# Patient Record
Sex: Female | Born: 1960 | Hispanic: No | Marital: Married | Smoking: Never smoker
Health system: Southern US, Community
[De-identification: ages and names within clinical notes are randomized; demographics above are authoritative.]

## PROBLEM LIST (undated history)

## (undated) DIAGNOSIS — E785 Hyperlipidemia, unspecified: Secondary | ICD-10-CM

## (undated) DIAGNOSIS — E119 Type 2 diabetes mellitus without complications: Secondary | ICD-10-CM

## (undated) HISTORY — DX: Hyperlipidemia, unspecified: E78.5

## (undated) HISTORY — DX: Type 2 diabetes mellitus without complications: E11.9

---

## 2006-12-13 ENCOUNTER — Encounter: Admission: RE | Admit: 2006-12-13 | Discharge: 2006-12-13 | Payer: Self-pay | Admitting: Internal Medicine

## 2008-01-02 ENCOUNTER — Encounter: Admission: RE | Admit: 2008-01-02 | Discharge: 2008-01-02 | Payer: Self-pay | Admitting: Internal Medicine

## 2008-01-18 ENCOUNTER — Encounter: Admission: RE | Admit: 2008-01-18 | Discharge: 2008-01-18 | Payer: Self-pay | Admitting: Internal Medicine

## 2009-01-05 HISTORY — PX: CATARACT EXTRACTION W/ INTRAOCULAR LENS IMPLANT: SHX1309

## 2010-01-26 ENCOUNTER — Encounter: Payer: Self-pay | Admitting: Internal Medicine

## 2010-06-24 ENCOUNTER — Other Ambulatory Visit: Payer: Self-pay | Admitting: Internal Medicine

## 2010-06-24 DIAGNOSIS — Z1231 Encounter for screening mammogram for malignant neoplasm of breast: Secondary | ICD-10-CM

## 2010-07-03 ENCOUNTER — Ambulatory Visit
Admission: RE | Admit: 2010-07-03 | Discharge: 2010-07-03 | Disposition: A | Discharge status: Home / Self Care | Payer: PRIVATE HEALTH INSURANCE | Source: Ambulatory Visit | Attending: Internal Medicine | Admitting: Internal Medicine

## 2010-07-03 DIAGNOSIS — Z1231 Encounter for screening mammogram for malignant neoplasm of breast: Secondary | ICD-10-CM

## 2010-11-11 ENCOUNTER — Ambulatory Visit
Admission: RE | Admit: 2010-11-11 | Discharge: 2010-11-11 | Disposition: A | Discharge status: Home / Self Care | Payer: PRIVATE HEALTH INSURANCE | Source: Ambulatory Visit | Attending: Internal Medicine | Admitting: Internal Medicine

## 2010-11-11 ENCOUNTER — Other Ambulatory Visit: Payer: Self-pay | Admitting: Internal Medicine

## 2010-11-11 DIAGNOSIS — R609 Edema, unspecified: Secondary | ICD-10-CM

## 2011-04-22 ENCOUNTER — Other Ambulatory Visit: Payer: Self-pay | Admitting: Internal Medicine

## 2011-04-22 DIAGNOSIS — M7989 Other specified soft tissue disorders: Secondary | ICD-10-CM

## 2011-04-27 ENCOUNTER — Ambulatory Visit
Admission: RE | Admit: 2011-04-27 | Discharge: 2011-04-27 | Disposition: A | Discharge status: Home / Self Care | Payer: PRIVATE HEALTH INSURANCE | Source: Ambulatory Visit | Attending: Internal Medicine | Admitting: Internal Medicine

## 2011-04-27 DIAGNOSIS — M7989 Other specified soft tissue disorders: Secondary | ICD-10-CM

## 2013-05-24 ENCOUNTER — Other Ambulatory Visit: Payer: Self-pay

## 2013-05-24 DIAGNOSIS — Z1231 Encounter for screening mammogram for malignant neoplasm of breast: Secondary | ICD-10-CM

## 2013-05-25 ENCOUNTER — Other Ambulatory Visit: Payer: Self-pay | Admitting: Internal Medicine

## 2013-05-25 ENCOUNTER — Ambulatory Visit
Admission: RE | Admit: 2013-05-25 | Discharge: 2013-05-25 | Disposition: A | Discharge status: Home / Self Care | Payer: PRIVATE HEALTH INSURANCE | Source: Ambulatory Visit | Attending: Internal Medicine | Admitting: Internal Medicine

## 2013-05-25 DIAGNOSIS — R7612 Nonspecific reaction to cell mediated immunity measurement of gamma interferon antigen response without active tuberculosis: Secondary | ICD-10-CM

## 2013-05-30 ENCOUNTER — Ambulatory Visit
Admission: RE | Admit: 2013-05-30 | Discharge: 2013-05-30 | Disposition: A | Discharge status: Home / Self Care | Payer: PRIVATE HEALTH INSURANCE | Source: Ambulatory Visit

## 2013-05-30 DIAGNOSIS — Z1231 Encounter for screening mammogram for malignant neoplasm of breast: Secondary | ICD-10-CM

## 2013-06-08 ENCOUNTER — Ambulatory Visit (INDEPENDENT_AMBULATORY_CARE_PROVIDER_SITE_OTHER): Payer: PRIVATE HEALTH INSURANCE | Admitting: Internal Medicine

## 2013-06-08 ENCOUNTER — Encounter: Payer: Self-pay | Admitting: Internal Medicine

## 2013-06-08 VITALS — BP 143/83 | HR 86 | Temp 98.1°F | Ht 64.0 in | Wt 155.0 lb

## 2013-06-08 DIAGNOSIS — M793 Panniculitis, unspecified: Secondary | ICD-10-CM

## 2013-06-08 NOTE — Progress Notes (Signed)
   Subjective:    Patient ID: Christina Lynn, female    DOB: [birth year], 53 y.o.   MRN: [id number]  HPI Here for evaluation as a new patient.  Has a history of paniculitis of left calf, biopsied and was c/w paniculitis on pathology.  It resolved without treatment but now comes in with similar syptoms of right calf, upper part, that is indurated.  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  She was treated with 3 drug therapy for active TB for 18 months.  Her PPD at the time thhough was negative.  She has no history of pulmonary disease.  She was evaluated for vascular and venous insufficiency with no issues identified.  She has had a weakly positive ANA, negative RPR.  She does have a history of BCG vaccine.  She had a positive Quantiferon Gold test as part of her recent evaluation.  No fever, no chills.  It has been very puruitic and has scars from scrathcing.     Review of Systems  Constitutional: Negative for fever, chills and fatigue.  Gastrointestinal: Negative for nausea and diarrhea.  Skin: Negative for rash.       Itching, swelling  Neurological: Negative for dizziness.  Hematological: Negative for adenopathy.       Objective:   Physical Exam  Constitutional: She appears well-developed and well-nourished. No distress.  Musculoskeletal:  Indurated area right upper calf about 3-4 cm x 2 cm.  Non tender, some mild warmth, no erythema.   Skin:  She has multipl areas of hyperpigmented area from scratch marks.          Assessment & Plan:

## 2013-06-08 NOTE — Assessment & Plan Note (Addendum)
This may again be panniculitis.  Differential of cause is broad.  No obvious cause in her.  Previous biopsy with inflammation c/w panniculitis.   Positive Quantiferon Gold of unclear significance. If she previouslyhad TB, it would be expected to be positive, also with history of BCG, it could be positive so does not narrow etiology.   I would not consider this latent Tb since she has previously been treated.      I would recommend biopsy for culture for AFB smear and cultures, bacterial culture, fungal culture and evaluation for parasites and histology.  I have asked her to schedule with Dr. Terri Piedra who she has seen before and I will ask him to consider biopsy for culture and anything else he may consider.  I will follow up with her following evaluation by Dr. Terri Piedra in about 4-5 weeks.

## 2013-07-13 ENCOUNTER — Ambulatory Visit: Payer: PRIVATE HEALTH INSURANCE | Admitting: Internal Medicine

## 2014-01-05 HISTORY — PX: BREAST BIOPSY: SHX20

## 2014-08-14 ENCOUNTER — Other Ambulatory Visit: Payer: Self-pay

## 2014-08-14 DIAGNOSIS — Z1231 Encounter for screening mammogram for malignant neoplasm of breast: Secondary | ICD-10-CM

## 2014-09-21 ENCOUNTER — Ambulatory Visit
Admission: RE | Admit: 2014-09-21 | Discharge: 2014-09-21 | Disposition: A | Discharge status: Home / Self Care | Payer: PRIVATE HEALTH INSURANCE | Source: Ambulatory Visit

## 2014-09-21 DIAGNOSIS — Z1231 Encounter for screening mammogram for malignant neoplasm of breast: Secondary | ICD-10-CM

## 2014-09-25 ENCOUNTER — Other Ambulatory Visit: Payer: Self-pay | Admitting: Internal Medicine

## 2014-09-25 DIAGNOSIS — R928 Other abnormal and inconclusive findings on diagnostic imaging of breast: Secondary | ICD-10-CM

## 2014-09-28 ENCOUNTER — Ambulatory Visit
Admission: RE | Admit: 2014-09-28 | Discharge: 2014-09-28 | Disposition: A | Discharge status: Home / Self Care | Payer: PRIVATE HEALTH INSURANCE | Source: Ambulatory Visit | Attending: Internal Medicine | Admitting: Internal Medicine

## 2014-09-28 ENCOUNTER — Other Ambulatory Visit: Payer: Self-pay | Admitting: Internal Medicine

## 2014-09-28 DIAGNOSIS — R928 Other abnormal and inconclusive findings on diagnostic imaging of breast: Secondary | ICD-10-CM

## 2014-09-28 DIAGNOSIS — R921 Mammographic calcification found on diagnostic imaging of breast: Secondary | ICD-10-CM

## 2014-10-01 ENCOUNTER — Ambulatory Visit
Admission: RE | Admit: 2014-10-01 | Discharge: 2014-10-01 | Disposition: A | Discharge status: Home / Self Care | Payer: PRIVATE HEALTH INSURANCE | Source: Ambulatory Visit | Attending: Internal Medicine | Admitting: Internal Medicine

## 2014-10-01 DIAGNOSIS — R921 Mammographic calcification found on diagnostic imaging of breast: Secondary | ICD-10-CM

## 2015-10-08 ENCOUNTER — Other Ambulatory Visit: Payer: Self-pay | Admitting: Internal Medicine

## 2015-10-08 DIAGNOSIS — Z1231 Encounter for screening mammogram for malignant neoplasm of breast: Secondary | ICD-10-CM

## 2015-10-18 ENCOUNTER — Ambulatory Visit
Admission: RE | Admit: 2015-10-18 | Discharge: 2015-10-18 | Disposition: A | Discharge status: Home / Self Care | Payer: PRIVATE HEALTH INSURANCE | Source: Ambulatory Visit | Attending: Internal Medicine | Admitting: Internal Medicine

## 2015-10-18 DIAGNOSIS — Z1231 Encounter for screening mammogram for malignant neoplasm of breast: Secondary | ICD-10-CM

## 2015-10-22 ENCOUNTER — Other Ambulatory Visit: Payer: Self-pay | Admitting: Internal Medicine

## 2015-10-22 DIAGNOSIS — R928 Other abnormal and inconclusive findings on diagnostic imaging of breast: Secondary | ICD-10-CM

## 2015-10-28 ENCOUNTER — Other Ambulatory Visit: Payer: PRIVATE HEALTH INSURANCE

## 2015-10-29 ENCOUNTER — Ambulatory Visit (INDEPENDENT_AMBULATORY_CARE_PROVIDER_SITE_OTHER): Payer: PRIVATE HEALTH INSURANCE | Admitting: Orthopedic Surgery

## 2015-10-29 ENCOUNTER — Encounter (INDEPENDENT_AMBULATORY_CARE_PROVIDER_SITE_OTHER): Payer: Self-pay | Admitting: Orthopedic Surgery

## 2015-10-29 ENCOUNTER — Ambulatory Visit (INDEPENDENT_AMBULATORY_CARE_PROVIDER_SITE_OTHER): Payer: PRIVATE HEALTH INSURANCE

## 2015-10-29 DIAGNOSIS — G8929 Other chronic pain: Secondary | ICD-10-CM

## 2015-10-29 DIAGNOSIS — M25511 Pain in right shoulder: Secondary | ICD-10-CM

## 2015-10-29 MED ORDER — METHYLPREDNISOLONE ACETATE 40 MG/ML IJ SUSP
40.0000 mg | INTRAMUSCULAR | Status: AC | PRN
  Administered 2015-10-29: 40 mg via INTRA_ARTICULAR

## 2015-10-29 MED ORDER — BUPIVACAINE HCL 0.5 % IJ SOLN
9.0000 mL | INTRAMUSCULAR | Status: AC | PRN
  Administered 2015-10-29: 9 mL via INTRA_ARTICULAR

## 2015-10-29 MED ORDER — LIDOCAINE HCL 1 % IJ SOLN
5.0000 mL | INTRAMUSCULAR | Status: AC | PRN
  Administered 2015-10-29: 5 mL

## 2015-10-29 NOTE — Progress Notes (Addendum)
Office Visit Note    Patient: Christina Lynn           Date of Birth: 09-14-1960           MRN: [id number] Visit Date: 10/29/2015              Requested by: No referring provider defined for this encounter. PCP: Ralene Ok, MD   Assessment & Plan: Visit Diagnoses:  1. Chronic right shoulder pain     Plan: Impression is right shoulder bursitis possible rotator cuff tear does not look like radiculopathy plan is to inject that shoulder today 9 mL of Marcaine was cc Depo-Medrol that helped her left shoulder in the past continue with anti-inflammatories if her symptoms recur then she'll need to come back for MRI scan we will 2 a note for her today to be out of work tomorrow.  Follow-Up Instructions: Return if symptoms worsen or fail to improve.   Orders:  Orders Placed This Encounter  Procedures  . Large Joint Injection/Arthrocentesis  . XR Shoulder Right   Meds ordered this encounter  Medications  . Dapagliflozin-Metformin HCl ER (XIGDUO XR) 10-998 MG TB24    Sig: Take by mouth.  . Insulin Glargine (LANTUS Hypoluxo)    Sig: Inject into the skin.  . cholecalciferol (VITAMIN D) 1000 units tablet    Sig: Take 1,000 Units by mouth daily.  . bupivacaine (MARCAINE) 0.5 % (with pres) injection 9 mL  . lidocaine (XYLOCAINE) 1 % (with pres) injection 5 mL  . methylPREDNISolone acetate (DEPO-MEDROL) injection 40 mg      Procedures: Large Joint Inj Date/Time: 10/29/2015 5:37 PM Performed by: Cammy Copa Authorized by: Cammy Copa   Consent Given by:  Patient Site marked: the procedure site was marked   Timeout: prior to procedure the correct patient, procedure, and site was verified   Indications:  Pain and diagnostic evaluation Location:  Shoulder Site:  R subacromial bursa Prep: patient was prepped and draped in usual sterile fashion   Needle Size:  18 G Needle Length:  1.5 inches Approach:  Posterior Ultrasound Guidance: No   Fluoroscopic Guidance: No   no Medications:  5 mL lidocaine 1 %; 9 mL bupivacaine 0.5 %; 40 mg methylPREDNISolone acetate 40 MG/ML Aspiration Attempted: No   Patient tolerance:  Patient tolerated the procedure well with no immediate complications     Clinical Data: No additional findings.   Subjective: Chief Complaint  Patient presents with  . Right Shoulder - Pain    HPI Problem bath the describes a two-month history H medical onset right shoulder pain.  Become severe over the past month.  Had an injection in the left shoulder previously which helped her get over a similar pain but that was several years ago.  She localizes the pain in the right arm to the deltoid region.  2 weeks ago she had a shot from an anterior approach.  Takes an Advil occasionally along with Relafen.  Nighttime symptoms are worse she denies any neck pain or numbness and tingling she works as a Social research officer, government the Health visitor adds  the following history.  Patient comes in today c/o 2 month history of right shoulder pain, NKI.  Pain is all around the shoulder jiont.  PCP gave her an injection 10/10/15, antieriorly, no relief.  Decreased and painful ROM.  She has taken Etodolac with no relief, she also has tried advil.  She has used topical OTC creams.     Review  of Systems all systems reviewed and negative is a relate to the right shoulder.  No fevers or chills   Objective: Vital Signs: There were no vitals taken for this visit.  Physical Exam on exam she is well-developed well-nourished distress alert and oriented normal by mass index normal gait and alignment rest from effort normal heart rate normal the sharp movements intact neck range of motion motion normal skin normal mood and normal affect normal  Ortho Exam right shoulder exam demonstrates full active and passive range of motion good rotator cuff strength a little bit of course grinding with internal/external rotation at 90 abduction and no before meals joint  tenderness direct palpation negative O'Brien's test negative apprehension relocation testing neck range of motion is normal.  Neck exam is normal.  No paresthesias C5 T1 reflexes normal radial pulse normal  Specialty Comments:  No specialty comments available.  Imaging: Xr Shoulder Right  Result Date: 10/29/2015 3 views right shoulder were ordered and obtained and interpreted.  Glenohumeral joint know arthritis before meals joint mild arthritis lung fields clear there is maintenance of the acromiohumeral distance other bony structures normal shoulder is located    PMFS History: Patient Active Problem List   Diagnosis Date Noted  . 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 Diabetes mellitus without complication (HCC)   . Hyperlipidemia     Family History  Problem Relation Age of Onset  . Diabetes Mother     Past Surgical History:  Procedure Laterality Date  . CATARACT EXTRACTION W/ INTRAOCULAR LENS IMPLANT Right 2011   Social History   Occupational History  . Not on file.   Social History Main Topics  . Smoking status: Never Smoker  . Smokeless tobacco: Never Used  . Alcohol use No  . Drug use: No  . Sexual activity: Not on file

## 2015-11-01 ENCOUNTER — Ambulatory Visit
Admission: RE | Admit: 2015-11-01 | Discharge: 2015-11-01 | Disposition: A | Discharge status: Home / Self Care | Payer: PRIVATE HEALTH INSURANCE | Source: Ambulatory Visit | Attending: Internal Medicine | Admitting: Internal Medicine

## 2015-11-01 DIAGNOSIS — R928 Other abnormal and inconclusive findings on diagnostic imaging of breast: Secondary | ICD-10-CM

## 2015-11-06 ENCOUNTER — Ambulatory Visit (INDEPENDENT_AMBULATORY_CARE_PROVIDER_SITE_OTHER): Payer: Self-pay | Admitting: Orthopedic Surgery

## 2015-12-09 ENCOUNTER — Ambulatory Visit (INDEPENDENT_AMBULATORY_CARE_PROVIDER_SITE_OTHER): Payer: PRIVATE HEALTH INSURANCE | Admitting: Orthopedic Surgery

## 2016-04-30 ENCOUNTER — Encounter (INDEPENDENT_AMBULATORY_CARE_PROVIDER_SITE_OTHER): Payer: Self-pay | Admitting: Orthopedic Surgery

## 2016-04-30 ENCOUNTER — Ambulatory Visit (INDEPENDENT_AMBULATORY_CARE_PROVIDER_SITE_OTHER): Payer: PRIVATE HEALTH INSURANCE | Admitting: Orthopedic Surgery

## 2016-04-30 DIAGNOSIS — M7501 Adhesive capsulitis of right shoulder: Secondary | ICD-10-CM

## 2016-04-30 DIAGNOSIS — M25511 Pain in right shoulder: Secondary | ICD-10-CM

## 2016-04-30 MED ORDER — METHYLPREDNISOLONE ACETATE 40 MG/ML IJ SUSP
40.0000 mg | INTRAMUSCULAR | Status: AC | PRN
  Administered 2016-04-30: 40 mg via INTRA_ARTICULAR

## 2016-04-30 MED ORDER — LIDOCAINE HCL 1 % IJ SOLN
5.0000 mL | INTRAMUSCULAR | Status: AC | PRN
  Administered 2016-04-30: 5 mL

## 2016-04-30 MED ORDER — BUPIVACAINE HCL 0.5 % IJ SOLN
9.0000 mL | INTRAMUSCULAR | Status: AC | PRN
  Administered 2016-04-30: 9 mL via INTRA_ARTICULAR

## 2016-04-30 NOTE — Progress Notes (Signed)
Office Visit Note   Patient: Christina Lynn           Date of Birth: 1960-12-01           MRN: [id number] Visit Date: 04/30/2016 Requested by: No referring provider defined for this encounter. PCP: Pcp Not In System  Subjective: Chief Complaint  Patient presents with  . 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  She had a subacromial injection in October of last year which helped for 1-2 months.  Now the pain is back and she reports increasing stiffness in the shoulder.  She is taking ibuprofen but that starting her for stomach.  She tried Biofreeze as well as minimal relief.  Reports primarily anterior and superior pain.  The pain will wake her from sleep at night.              ROS: All systems reviewed are negative as they relate to the chief complaint within the history of present illness.  Patient denies  fevers or chills.   Assessment & Plan: Visit Diagnoses:  1. Right shoulder pain, unspecified chronicity     Plan: Impression is early right frozen shoulder with some restriction of external rotation for flexion and abduction right versus left.  Rotator cuff strength is pretty reasonable.  Plan is MRI scan to rule out labral/biceps pathology.  Injection performed today into the glenohumeral joint.  We will also start physical therapy for manual PT and range of motion.  See her back in 6 weeks for clinical recheck.  Follow-Up Instructions: No Follow-up on file.   Orders:  No orders of the defined types were placed in this encounter.  No orders of the defined types were placed in this encounter.     Procedures: Large Joint Inj Date/Time: 04/30/2016 8:43 AM Performed by: Cammy Copa Authorized by: Cammy Copa   Consent Given by:  Patient Site marked: the procedure site was marked   Timeout: prior to procedure the correct patient, procedure, and site was verified   Indications:  Pain and  diagnostic evaluation Location:  Shoulder Site:  R glenohumeral Prep: patient was prepped and draped in usual sterile fashion   Needle Size:  18 G Needle Length:  1.5 inches Approach:  Posterior Ultrasound Guidance: No   Fluoroscopic Guidance: No   Arthrogram: No   Medications:  5 mL lidocaine 1 %; 9 mL bupivacaine 0.5 %; 40 mg methylPREDNISolone acetate 40 MG/ML Aspiration Attempted: No   Patient tolerance:  Patient tolerated the procedure well with no immediate complications     Clinical Data: No additional findings.  Objective: Vital Signs: There were no vitals taken for this visit.  Physical Exam:   Constitutional: Patient appears well-developed HEENT:  Head: Normocephalic Eyes:EOM are normal Neck: Normal range of motion Cardiovascular: Normal rate Pulmonary/chest: Effort normal Neurologic: Patient is alert Skin: Skin is warm Psychiatric: Patient has normal mood and affect    Ortho Exam: Orthopedic exam demonstrates good cervical spine range of motion.  Palpable radial pulses.  5 out of 5 grip EPL FPL interosseous wrist flexion-extension biceps triceps and deltoid strength.  Patient has restricted external rotation at 15 abduction and 50 on the right compared to 70 on the left.  Isolated glenohumeral forward flexion 180 on the left compared to 140 on the right.  Isolated glenohumeral abduction and a 5 on the right compared to 110 on the left.  No other masses lymph  adenopathy or skin changes noted in the shoulder girdle region on the right  Specialty Comments:  No specialty comments available.  Imaging: No results found.   PMFS History: Patient Active Problem List   Diagnosis Date Noted  . 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 Diabetes mellitus without complication (HCC)   . Hyperlipidemia     Family History  Problem Relation Age of Onset  . Diabetes Mother     Past Surgical History:  Procedure Laterality  Date  . CATARACT EXTRACTION W/ INTRAOCULAR LENS IMPLANT Right 2011   Social History   Occupational History  . Not on file.   Social History Main Topics  . Smoking status: Never Smoker  . Smokeless tobacco: Never Used  . Alcohol use No  . Drug use: No  . Sexual activity: Not on file

## 2016-11-11 ENCOUNTER — Other Ambulatory Visit: Payer: Self-pay | Admitting: Internal Medicine

## 2016-11-11 DIAGNOSIS — Z1231 Encounter for screening mammogram for malignant neoplasm of breast: Secondary | ICD-10-CM

## 2016-11-18 ENCOUNTER — Ambulatory Visit
Admission: RE | Admit: 2016-11-18 | Discharge: 2016-11-18 | Disposition: A | Discharge status: Home / Self Care | Payer: PRIVATE HEALTH INSURANCE | Source: Ambulatory Visit | Attending: Internal Medicine | Admitting: Internal Medicine

## 2016-11-18 DIAGNOSIS — Z1231 Encounter for screening mammogram for malignant neoplasm of breast: Secondary | ICD-10-CM

## 2020-11-05 ENCOUNTER — Other Ambulatory Visit: Payer: Self-pay | Admitting: Internal Medicine

## 2020-11-05 DIAGNOSIS — Z1231 Encounter for screening mammogram for malignant neoplasm of breast: Secondary | ICD-10-CM

## 2020-11-06 ENCOUNTER — Other Ambulatory Visit: Payer: Self-pay

## 2020-11-06 ENCOUNTER — Ambulatory Visit
Admission: RE | Admit: 2020-11-06 | Discharge: 2020-11-06 | Disposition: A | Discharge status: Home / Self Care | Payer: Managed Care, Other (non HMO) | Source: Ambulatory Visit | Attending: Internal Medicine | Admitting: Internal Medicine

## 2020-11-06 DIAGNOSIS — Z1231 Encounter for screening mammogram for malignant neoplasm of breast: Secondary | ICD-10-CM

## 2022-11-26 ENCOUNTER — Ambulatory Visit: Payer: Managed Care, Other (non HMO) | Admitting: Orthopedic Surgery

## 2022-11-26 ENCOUNTER — Other Ambulatory Visit (INDEPENDENT_AMBULATORY_CARE_PROVIDER_SITE_OTHER): Payer: Managed Care, Other (non HMO)

## 2022-11-26 ENCOUNTER — Other Ambulatory Visit: Payer: Self-pay

## 2022-11-26 DIAGNOSIS — M79601 Pain in right arm: Secondary | ICD-10-CM

## 2022-11-26 DIAGNOSIS — M7551 Bursitis of right shoulder: Secondary | ICD-10-CM

## 2022-11-27 NOTE — Progress Notes (Unsigned)
Office Visit Note   Patient: Christina Lynn           Date of Birth: 09/10/1960           MRN: [id number] Visit Date: 11/26/2022 Requested by: Ralene Ok, MD [id number] [place],  [place] [address] PCP: Ralene Ok, MD  Subjective: Chief Complaint  Patient presents with   Right Shoulder - Pain   Neck - Pain    HPI: Christina Lynn is a 62 y.o. female who presents to the office reporting right shoulder pain for 4 months duration.  Denies a history of injury.  Does report some radiating arm pain as well as occasional numbness and tingling.  Denies much in way of neck pain.  Tylenol helps some with her symptoms.  She has an MRI scan from September 2024.  There is mild tendinitis of the supraspinatus tendon as well as some partial and full-thickness areas of cartilage loss in the glenohumeral joint..                ROS: All systems reviewed are negative as they relate to the chief complaint within the history of present illness.  Patient denies fevers or chills.  Assessment & Plan: Visit Diagnoses:  1. Right arm pain     Plan: Impression is right shoulder arthritis with reasonably well-maintained range of motion.  She would like to have ultrasound-guided injection into the right shoulder joint which has helped her in the past.  This performed today under ultrasound guidance.  She will follow-up with [place] as needed.  Follow-Up Instructions: No follow-ups on file.   Orders:  Orders Placed This Encounter  Procedures   XR Shoulder Right   XR Cervical Spine 2 or 3 views   US Guided Needle Placement - No Linked Charges   No orders of the defined types were placed in this encounter.     Procedures: Large Joint Inj: R glenohumeral on 11/26/2022 4:52 PM Indications: diagnostic evaluation and pain Details: 22 G 1.5 in needle, posterior approach  Arthrogram: No  Medications: 9 mL bupivacaine 0.5 %; 40 mg methylPREDNISolone acetate 40 MG/ML; 5 mL lidocaine 1 % Outcome: tolerated  well, no immediate complications Procedure, treatment alternatives, risks and benefits explained, specific risks discussed. Consent was given by the patient. Immediately prior to procedure a time out was called to verify the correct patient, procedure, equipment, support staff and site/side marked as required. Patient was prepped and draped in the usual sterile fashion.       Clinical Data: No additional findings.  Objective: Vital Signs: There were no vitals taken for this visit.  Physical Exam:  Constitutional: Patient appears well-developed HEENT:  Head: Normocephalic Eyes:EOM are normal Neck: Normal range of motion Cardiovascular: Normal rate Pulmonary/chest: Effort normal Neurologic: Patient is alert Skin: Skin is warm Psychiatric: Patient has normal mood and affect  Ortho Exam: Ortho exam demonstrates range of motion on the right and left of 65/105/170.  She has mild crepitus with internal/external rotation in both shoulders.  Rotator cuff strength is intact in both shoulders infraspinatus supraspinatus and subscap muscle testing.  No discrete AC joint tenderness right versus left.  Cervical spine range of motion is full.  No other masses lymphadenopathy or skin changes noted in that shoulder girdle region.  Specialty Comments:  No specialty comments available.  Imaging: No results found.   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

## 2022-11-28 MED ORDER — LIDOCAINE HCL 1 % IJ SOLN
5.0000 mL | INTRAMUSCULAR | Status: AC | PRN
  Administered 2022-11-26: 5 mL

## 2022-11-28 MED ORDER — METHYLPREDNISOLONE ACETATE 40 MG/ML IJ SUSP
40.0000 mg | INTRAMUSCULAR | Status: AC | PRN
  Administered 2022-11-26: 40 mg via INTRA_ARTICULAR

## 2022-11-28 MED ORDER — BUPIVACAINE HCL 0.5 % IJ SOLN
9.0000 mL | INTRAMUSCULAR | Status: AC | PRN
  Administered 2022-11-26: 9 mL via INTRA_ARTICULAR

## 2023-04-05 ENCOUNTER — Other Ambulatory Visit: Payer: Self-pay | Admitting: Internal Medicine

## 2023-04-05 DIAGNOSIS — Z1231 Encounter for screening mammogram for malignant neoplasm of breast: Secondary | ICD-10-CM

## 2023-04-09 ENCOUNTER — Ambulatory Visit
Admission: RE | Admit: 2023-04-09 | Discharge: 2023-04-09 | Disposition: A | Discharge status: Home / Self Care | Source: Ambulatory Visit | Attending: Internal Medicine | Admitting: Internal Medicine

## 2023-04-09 ENCOUNTER — Encounter: Payer: Self-pay | Admitting: Radiology

## 2023-04-09 DIAGNOSIS — Z1231 Encounter for screening mammogram for malignant neoplasm of breast: Secondary | ICD-10-CM

## 2023-06-10 IMAGING — MG MM DIGITAL SCREENING BILAT W/ TOMO AND CAD
8 series · 8 of 24 positions shown · non-contrast
Comparison: Previous exam(s).

CLINICAL DATA: Screening.

EXAM:
DIGITAL SCREENING BILATERAL MAMMOGRAM WITH TOMOSYNTHESIS AND CAD
TECHNIQUE: Bilateral screening digital craniocaudal and mediolateral oblique
mammograms were obtained. Bilateral screening digital breast
tomosynthesis was performed. The images were evaluated with
computer-aided detection.

[R MLO synth-2D]
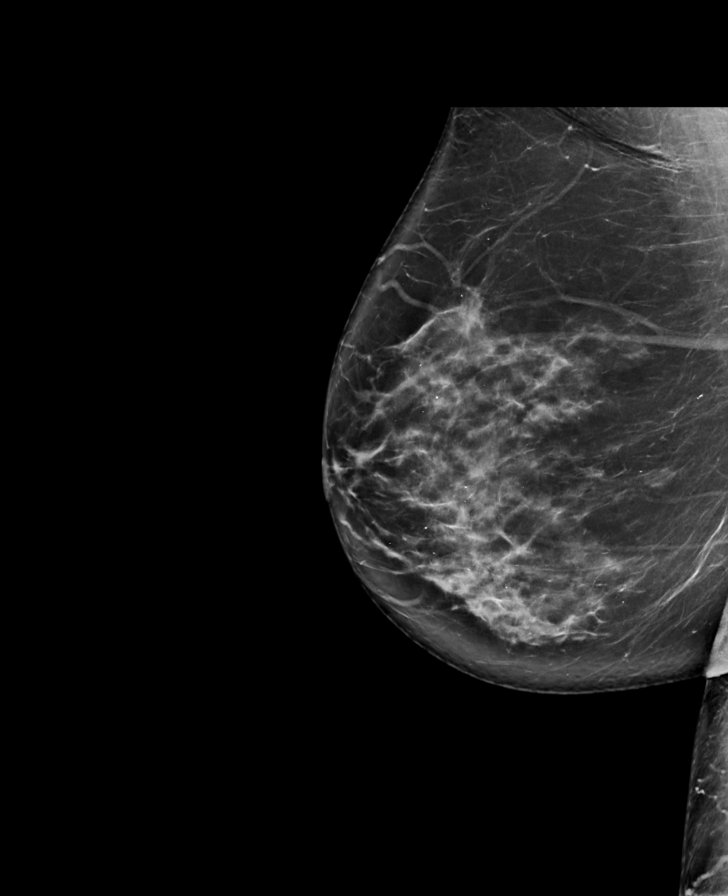

[L CC synth-2D]
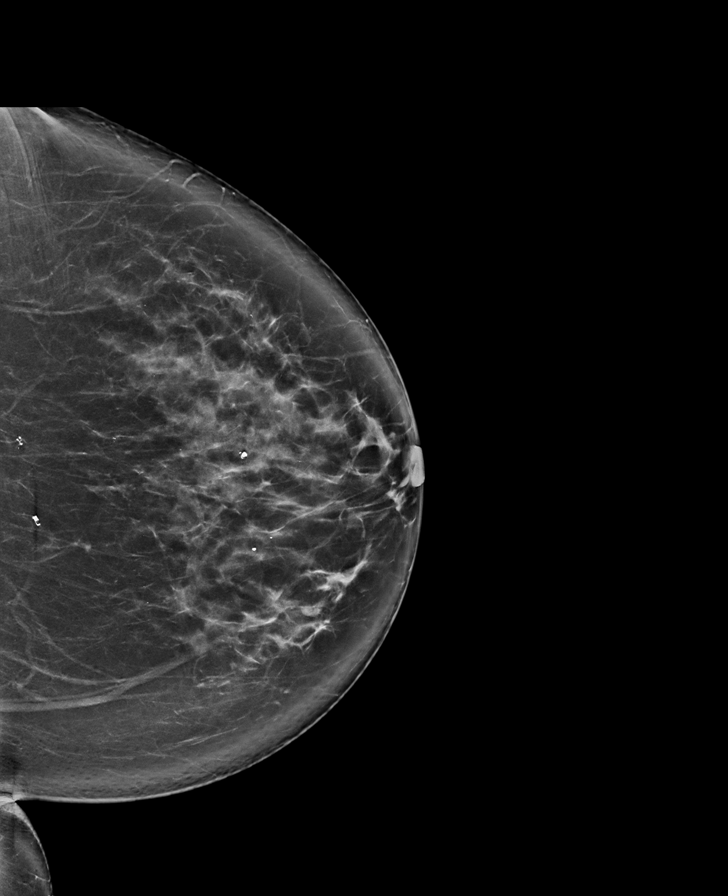

[R CC synth-2D]
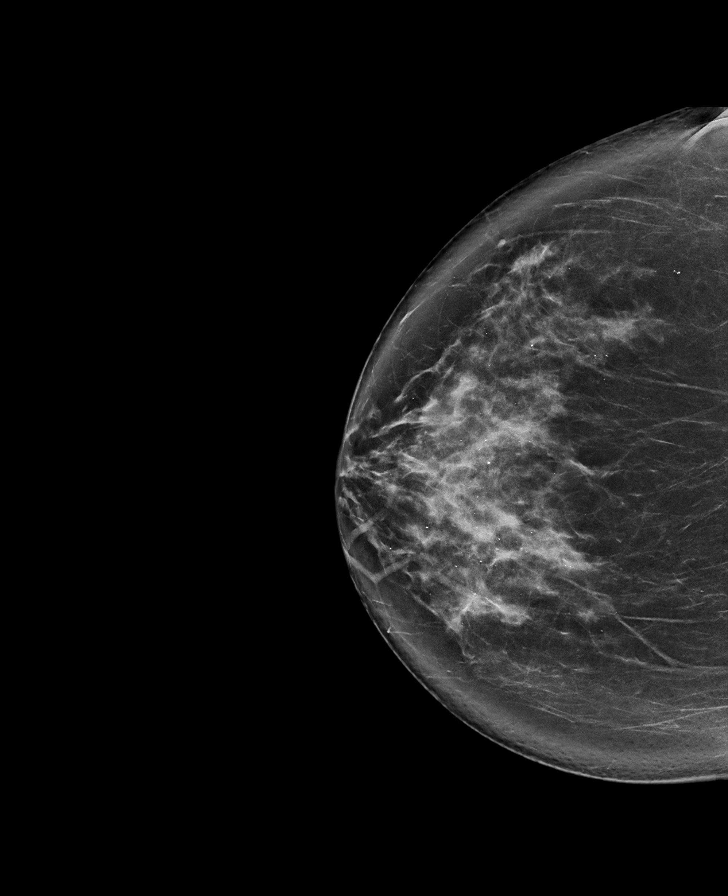

[L MLO synth-2D]
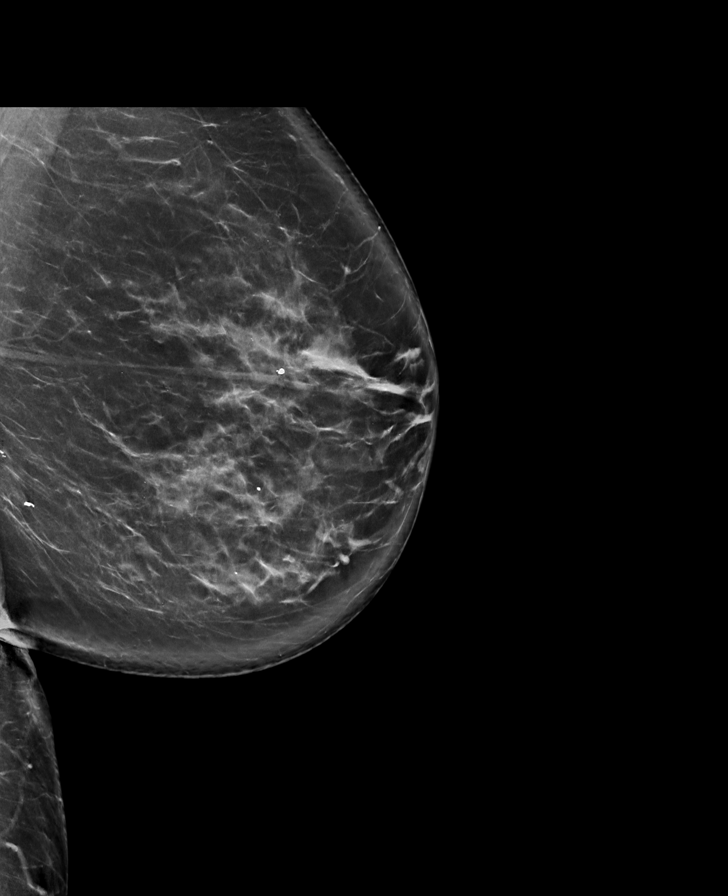

[R CC tomo · tomo slice 46/91.0]
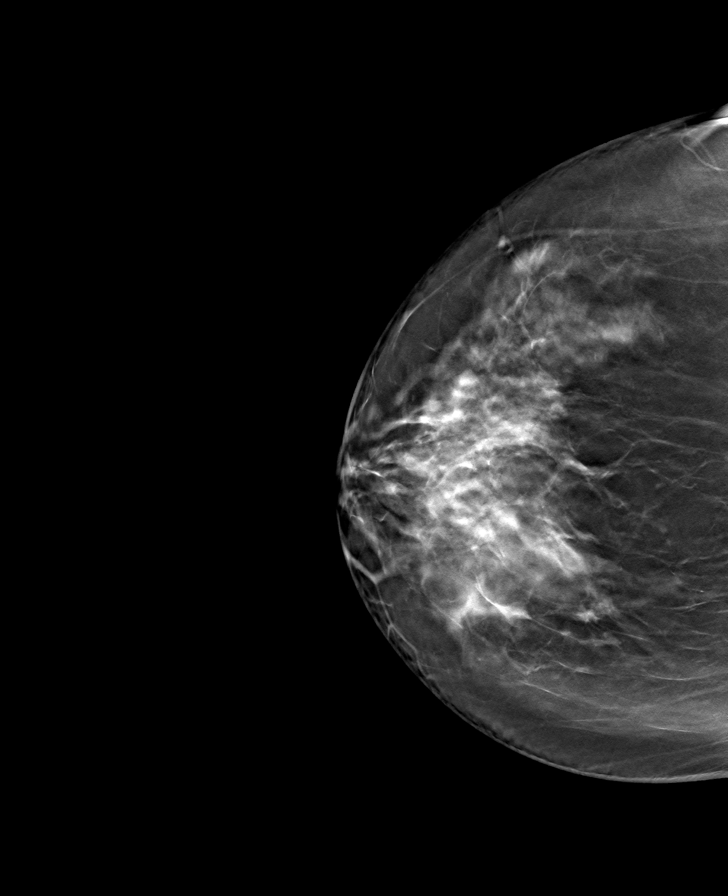

[R MLO tomo · tomo slice 49/96.0]
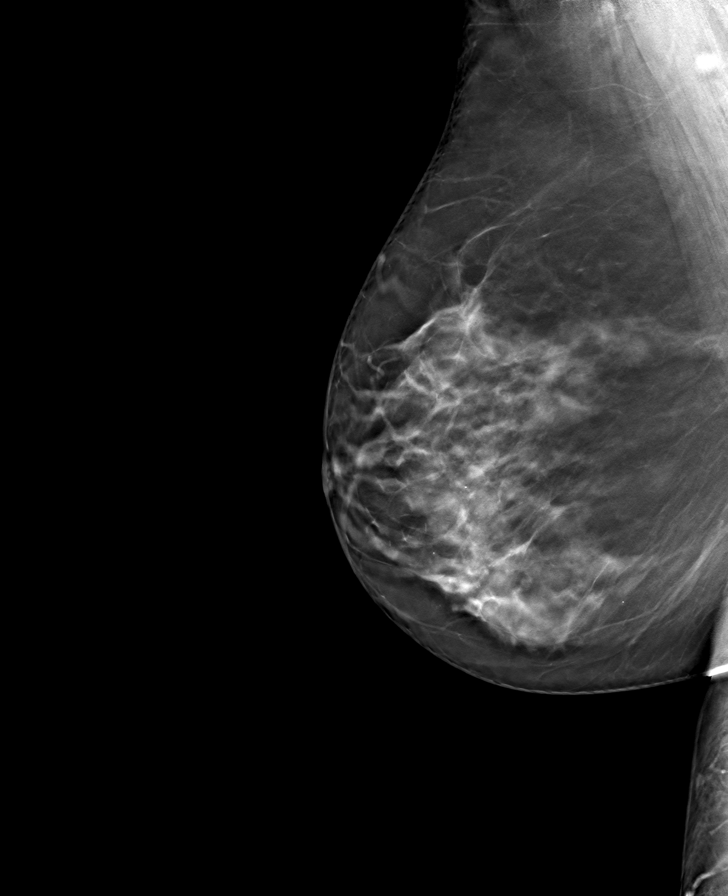

[L MLO tomo · tomo slice 49/98.0]
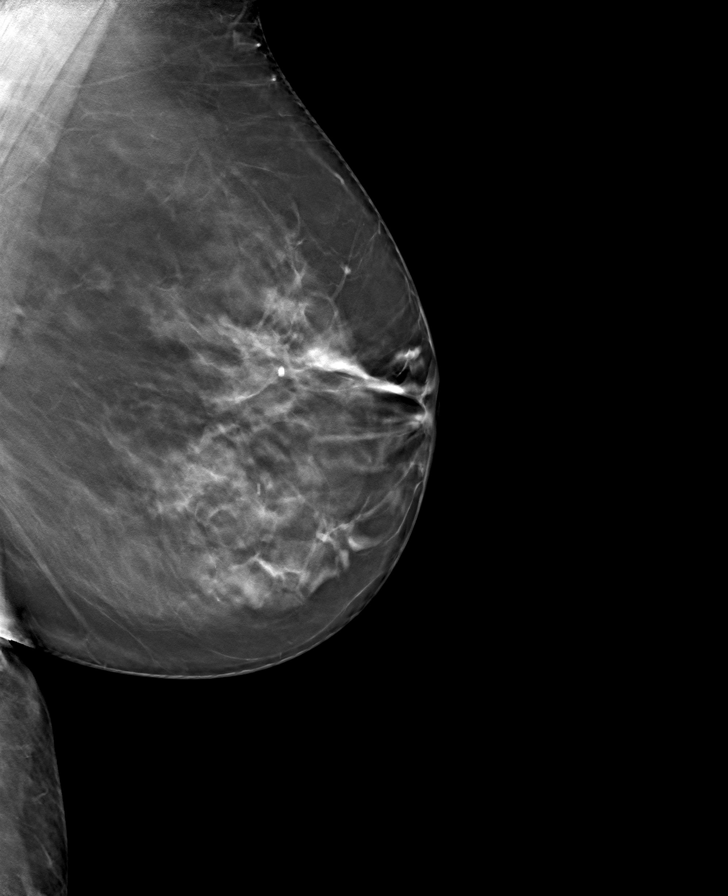

[L CC tomo · tomo slice 49/96.0]
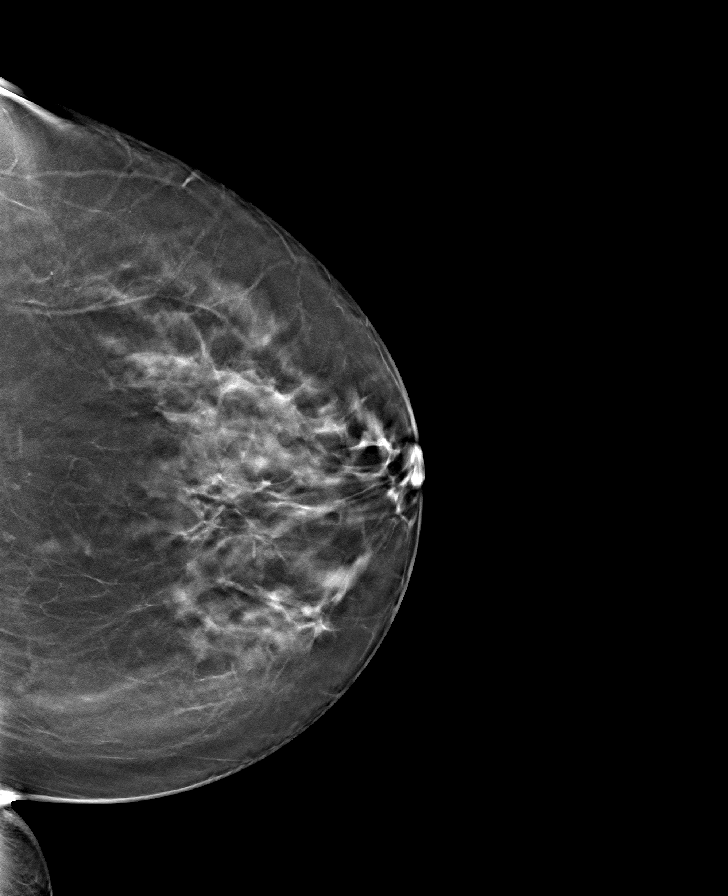

[8 of 24 positions shown; findings below may reference images not displayed]

ACR Breast Density Category c: The breast tissue is heterogeneously
dense, which may obscure small masses.
FINDINGS: There are no findings suspicious for malignancy.
IMPRESSION: No mammographic evidence of malignancy. A result letter of this
screening mammogram will be mailed directly to the patient.

RECOMMENDATION:
Screening mammogram in one year. (Code:Q3-W-BC3)

BI-RADS CATEGORY  1: Negative.

## 2023-10-22 ENCOUNTER — Other Ambulatory Visit: Payer: Self-pay | Admitting: Internal Medicine

## 2023-10-22 DIAGNOSIS — K7581 Nonalcoholic steatohepatitis (NASH): Secondary | ICD-10-CM

## 2023-11-04 ENCOUNTER — Ambulatory Visit
Admission: RE | Admit: 2023-11-04 | Discharge: 2023-11-04 | Disposition: A | Source: Ambulatory Visit | Attending: Internal Medicine | Admitting: Internal Medicine

## 2023-11-04 DIAGNOSIS — K7581 Nonalcoholic steatohepatitis (NASH): Secondary | ICD-10-CM

## 2023-11-08 ENCOUNTER — Encounter: Payer: Self-pay | Admitting: Radiology
